# Patient Record
Sex: Female | Born: 1980 | Race: Black or African American | Hispanic: No | Marital: Single | Smoking: Current every day smoker
Health system: Southern US, Community
[De-identification: ages and names within clinical notes are randomized; demographics above are authoritative.]

---

## 2003-12-15 ENCOUNTER — Other Ambulatory Visit: Admission: RE | Admit: 2003-12-15 | Discharge: 2003-12-15 | Payer: Self-pay | Admitting: Obstetrics and Gynecology

## 2004-01-13 ENCOUNTER — Other Ambulatory Visit: Admission: RE | Admit: 2004-01-13 | Discharge: 2004-01-13 | Payer: Self-pay | Admitting: Obstetrics and Gynecology

## 2004-05-22 ENCOUNTER — Emergency Department (HOSPITAL_COMMUNITY): Admission: EM | Admit: 2004-05-22 | Discharge: 2004-05-22 | Payer: Self-pay | Admitting: *Deleted

## 2006-06-06 ENCOUNTER — Emergency Department (HOSPITAL_COMMUNITY): Admission: EM | Admit: 2006-06-06 | Discharge: 2006-06-06 | Payer: Self-pay | Admitting: Family Medicine

## 2006-07-22 ENCOUNTER — Emergency Department (HOSPITAL_COMMUNITY): Admission: EM | Admit: 2006-07-22 | Discharge: 2006-07-22 | Payer: Self-pay | Admitting: Family Medicine

## 2006-10-03 ENCOUNTER — Emergency Department (HOSPITAL_COMMUNITY): Admission: EM | Admit: 2006-10-03 | Discharge: 2006-10-03 | Payer: Self-pay | Admitting: Emergency Medicine

## 2007-01-13 ENCOUNTER — Emergency Department (HOSPITAL_COMMUNITY): Admission: EM | Admit: 2007-01-13 | Discharge: 2007-01-13 | Payer: Self-pay | Admitting: Emergency Medicine

## 2007-12-16 ENCOUNTER — Other Ambulatory Visit: Admission: RE | Admit: 2007-12-16 | Discharge: 2007-12-16 | Payer: Self-pay | Admitting: Gynecology

## 2008-07-26 ENCOUNTER — Emergency Department (HOSPITAL_COMMUNITY): Admission: EM | Admit: 2008-07-26 | Discharge: 2008-07-27 | Payer: Self-pay | Admitting: Emergency Medicine

## 2008-12-27 ENCOUNTER — Emergency Department (HOSPITAL_BASED_OUTPATIENT_CLINIC_OR_DEPARTMENT_OTHER): Admission: EM | Admit: 2008-12-27 | Discharge: 2008-12-27 | Payer: Self-pay | Admitting: Emergency Medicine

## 2009-06-23 ENCOUNTER — Emergency Department (HOSPITAL_BASED_OUTPATIENT_CLINIC_OR_DEPARTMENT_OTHER): Admission: EM | Admit: 2009-06-23 | Discharge: 2009-06-23 | Payer: Self-pay | Admitting: Emergency Medicine

## 2009-06-23 ENCOUNTER — Ambulatory Visit: Payer: Self-pay | Admitting: Diagnostic Radiology

## 2009-08-05 ENCOUNTER — Emergency Department (HOSPITAL_COMMUNITY): Admission: EM | Admit: 2009-08-05 | Discharge: 2009-08-05 | Payer: Self-pay | Admitting: Emergency Medicine

## 2009-11-27 ENCOUNTER — Emergency Department (HOSPITAL_COMMUNITY): Admission: EM | Admit: 2009-11-27 | Discharge: 2009-11-27 | Payer: Self-pay | Admitting: Emergency Medicine

## 2009-12-20 ENCOUNTER — Emergency Department (HOSPITAL_BASED_OUTPATIENT_CLINIC_OR_DEPARTMENT_OTHER): Admission: EM | Admit: 2009-12-20 | Discharge: 2009-12-20 | Payer: Self-pay | Admitting: Emergency Medicine

## 2009-12-20 ENCOUNTER — Ambulatory Visit: Payer: Self-pay | Admitting: Diagnostic Radiology

## 2010-04-10 ENCOUNTER — Emergency Department (HOSPITAL_COMMUNITY): Admission: EM | Admit: 2010-04-10 | Discharge: 2010-04-10 | Payer: Self-pay | Admitting: Emergency Medicine

## 2010-09-07 ENCOUNTER — Encounter: Admission: RE | Admit: 2010-09-07 | Discharge: 2010-09-07 | Payer: Self-pay | Admitting: Family Medicine

## 2011-01-25 LAB — D-DIMER, QUANTITATIVE: D-Dimer, Quant: 0.5 ug/mL-FEU — ABNORMAL HIGH (ref 0.00–0.48)

## 2011-02-21 LAB — URINALYSIS, ROUTINE W REFLEX MICROSCOPIC
Glucose, UA: NEGATIVE mg/dL
Nitrite: NEGATIVE
Protein, ur: NEGATIVE mg/dL
pH: 6.5 (ref 5.0–8.0)

## 2011-02-21 LAB — BASIC METABOLIC PANEL
Calcium: 9.3 mg/dL (ref 8.4–10.5)
Chloride: 107 mEq/L (ref 96–112)
Creatinine, Ser: 0.8 mg/dL (ref 0.4–1.2)
GFR calc Af Amer: >60 mL/min (ref >60)
GFR calc non Af Amer: >60 mL/min (ref >60)

## 2011-02-21 LAB — DIFFERENTIAL
Lymphocytes Relative: 32 % (ref 12–46)
Lymphs Abs: 1.9 10*3/uL (ref 0.7–4.0)
Monocytes Relative: 10 % (ref 3–12)
Neutro Abs: 3.4 10*3/uL (ref 1.7–7.7)
Neutrophils Relative %: 56 % (ref 43–77)

## 2011-02-21 LAB — CBC
RBC: 4.51 MIL/uL (ref 3.87–5.11)
WBC: 6.1 10*3/uL (ref 4.0–10.5)

## 2011-02-21 LAB — PREGNANCY, URINE: Preg Test, Ur: NEGATIVE

## 2011-10-12 ENCOUNTER — Encounter: Payer: Self-pay | Admitting: Emergency Medicine

## 2011-10-12 ENCOUNTER — Emergency Department (HOSPITAL_COMMUNITY)
Admission: EM | Admit: 2011-10-12 | Discharge: 2011-10-12 | Disposition: A | Discharge status: Home / Self Care | Payer: Self-pay | Attending: Emergency Medicine | Admitting: Emergency Medicine

## 2011-10-12 DIAGNOSIS — IMO0001 Reserved for inherently not codable concepts without codable children: Secondary | ICD-10-CM | POA: Insufficient documentation

## 2011-10-12 DIAGNOSIS — L0231 Cutaneous abscess of buttock: Secondary | ICD-10-CM | POA: Insufficient documentation

## 2011-10-12 DIAGNOSIS — R609 Edema, unspecified: Secondary | ICD-10-CM | POA: Insufficient documentation

## 2011-10-12 DIAGNOSIS — F17200 Nicotine dependence, unspecified, uncomplicated: Secondary | ICD-10-CM | POA: Insufficient documentation

## 2011-10-12 DIAGNOSIS — L03317 Cellulitis of buttock: Secondary | ICD-10-CM | POA: Insufficient documentation

## 2011-10-12 MED ORDER — KETOROLAC TROMETHAMINE 60 MG/2ML IM SOLN: 60.0000 mg | Freq: Once | INTRAMUSCULAR | Status: DC

## 2011-10-12 NOTE — ED Provider Notes (Signed)
Medical screening examination/treatment/procedure(s) were performed by non-physician practitioner and as supervising physician I was immediately available for consultation/collaboration.   Benny Lennert, MD 10/12/11 2025

## 2011-10-12 NOTE — ED Provider Notes (Signed)
History     CSN: [id number] Arrival date & time: 10/12/2011  2:50 PM   First MD Initiated Contact with Patient 10/12/11 1612      Chief Complaint  Patient presents with  . Abscess    (Consider location/radiation/quality/duration/timing/severity/associated sxs/prior treatment) HPI Comments: Patient here with right buttock abscess for the past 3 weeks - reports drained about 1week ago - none now - denies fever orchills.  Patient is a 30 y.o. female presenting with abscess. The history is provided by the patient. No language interpreter was used.  Abscess  This is a new problem. The current episode started more than one week ago. The onset was gradual. The problem occurs rarely. The problem has been unchanged. The abscess is present on the right buttock. The problem is moderate. The abscess is characterized by painfulness and swelling. The patient was exposed to OTC medications. The abscess first occurred at home. Pertinent negatives include no anorexia, not sleeping less, no fever, no diarrhea, no vomiting, no rhinorrhea and no sore throat. Her past medical history does not include skin abscesses in family. There were no sick contacts. She has received no recent medical care.    History reviewed. No pertinent past medical history.  History reviewed. No pertinent past surgical history.  History reviewed. No pertinent family history.  History  Substance Use Topics  . Smoking status: Current Everyday Smoker  . Smokeless tobacco: Not on file  . Alcohol Use: No    OB History    Grav Para Term Preterm Abortions TAB SAB Ect Mult Living                  Review of Systems  Constitutional: Negative for fever.  HENT: Negative for sore throat and rhinorrhea.   Gastrointestinal: Negative for vomiting, diarrhea and anorexia.  All other systems reviewed and are negative.    Allergies  Augmentin  Home Medications  No current outpatient prescriptions on file.  BP 138/77  Pulse 88   Temp(Src) 98.5 F (36.9 C) (Oral)  Resp 20  SpO2 100%  Physical Exam  Nursing note and vitals reviewed. Constitutional: She is oriented to person, place, and time. She appears well-developed and well-nourished. No distress.  HENT:  Head: Normocephalic and atraumatic.  Right Ear: External ear normal.  Left Ear: External ear normal.  Mouth/Throat: Oropharynx is clear and moist.  Eyes: Conjunctivae are normal. Pupils are equal, round, and reactive to light.  Neck: Normal range of motion. Neck supple.  Cardiovascular: Normal rate, regular rhythm and normal heart sounds.   Pulmonary/Chest: Effort normal. She exhibits no tenderness.  Abdominal: Soft. There is no tenderness.  Musculoskeletal: Normal range of motion.  Neurological: She is alert and oriented to person, place, and time.  Skin: Skin is warm and dry. Rash noted.  Psychiatric: She has a normal mood and affect. Her behavior is normal. Judgment and thought content normal.    ED Course  Procedures (including critical care time)  Labs Reviewed - No data to display No results found.   Right buttock abscess   MDM  Patient with abscess to right buttocks, no drainage now.  She reports to me that she cannot stay any longer as I was entering room to do I&D.  She reports she will return later for I&D        Scarlette Calico C. [place], [place] 10/12/11 1753

## 2011-10-12 NOTE — ED Notes (Signed)
Called for pt. Not in waiting room.

## 2011-10-12 NOTE — ED Notes (Signed)
Pt here for abscess to right buttocks x 3 weeks; pt sts some drainage but not at present

## 2015-07-04 ENCOUNTER — Encounter (HOSPITAL_COMMUNITY): Payer: Self-pay

## 2015-07-04 ENCOUNTER — Emergency Department (HOSPITAL_COMMUNITY)
Admission: EM | Admit: 2015-07-04 | Discharge: 2015-07-04 | Disposition: A | Discharge status: Home / Self Care | Payer: Self-pay | Attending: Emergency Medicine | Admitting: Emergency Medicine

## 2015-07-04 DIAGNOSIS — Z3202 Encounter for pregnancy test, result negative: Secondary | ICD-10-CM | POA: Insufficient documentation

## 2015-07-04 DIAGNOSIS — Z880 Allergy status to penicillin: Secondary | ICD-10-CM | POA: Insufficient documentation

## 2015-07-04 DIAGNOSIS — R55 Syncope and collapse: Secondary | ICD-10-CM | POA: Insufficient documentation

## 2015-07-04 DIAGNOSIS — Z720 Tobacco use: Secondary | ICD-10-CM | POA: Insufficient documentation

## 2015-07-04 LAB — BASIC METABOLIC PANEL
Anion gap: 8 (ref 5–15)
BUN: 10 mg/dL (ref 6–20)
CHLORIDE: 107 mmol/L (ref 101–111)
CO2: 25 mmol/L (ref 22–32)
CREATININE: 0.92 mg/dL (ref 0.44–1.00)
Calcium: 9.2 mg/dL (ref 8.9–10.3)
GFR calc non Af Amer: >60 mL/min (ref >60)
GLUCOSE: 71 mg/dL (ref 65–99)
Potassium: 4.2 mmol/L (ref 3.5–5.1)
Sodium: 140 mmol/L (ref 135–145)

## 2015-07-04 LAB — URINALYSIS, ROUTINE W REFLEX MICROSCOPIC
GLUCOSE, UA: NEGATIVE mg/dL
Ketones, ur: 15 mg/dL — AB
Nitrite: NEGATIVE
Protein, ur: 30 mg/dL — AB
SPECIFIC GRAVITY, URINE: 1.031 — AB (ref 1.005–1.030)
Urobilinogen, UA: 1 mg/dL (ref 0.0–1.0)
pH: 6 (ref 5.0–8.0)

## 2015-07-04 LAB — PREGNANCY, URINE: Preg Test, Ur: NEGATIVE

## 2015-07-04 LAB — CBC WITH DIFFERENTIAL/PLATELET
Basophils Absolute: 0 10*3/uL (ref 0.0–0.1)
Basophils Relative: 0 % (ref 0–1)
EOS ABS: 0.1 10*3/uL (ref 0.0–0.7)
Eosinophils Relative: 2 % (ref 0–5)
HEMATOCRIT: 39.2 % (ref 36.0–46.0)
HEMOGLOBIN: 13.4 g/dL (ref 12.0–15.0)
LYMPHS ABS: 1.6 10*3/uL (ref 0.7–4.0)
Lymphocytes Relative: 24 % (ref 12–46)
MCH: 30.4 pg (ref 26.0–34.0)
MCHC: 34.2 g/dL (ref 30.0–36.0)
MCV: 88.9 fL (ref 78.0–100.0)
MONO ABS: 0.6 10*3/uL (ref 0.1–1.0)
MONOS PCT: 9 % (ref 3–12)
NEUTROS PCT: 65 % (ref 43–77)
Neutro Abs: 4.3 10*3/uL (ref 1.7–7.7)
Platelets: 264 10*3/uL (ref 150–400)
RBC: 4.41 MIL/uL (ref 3.87–5.11)
RDW: 13.4 % (ref 11.5–15.5)
WBC: 6.7 10*3/uL (ref 4.0–10.5)

## 2015-07-04 LAB — URINE MICROSCOPIC-ADD ON

## 2015-07-04 NOTE — ED Notes (Signed)
MD Cook at the bedside.  

## 2015-07-04 NOTE — Discharge Instructions (Signed)
Tests were normal. Increase fluids. Eat frequent small meals. Follow-up with your primary care doctor.

## 2015-07-04 NOTE — ED Provider Notes (Signed)
CSN: [id number]     Arrival date & time 07/04/15  1548 History   First MD Initiated Contact with Patient 07/04/15 1607     Chief Complaint  Patient presents with  . Loss of Consciousness     (Consider location/radiation/quality/duration/timing/severity/associated sxs/prior Treatment) HPI... Status post syncopal event.   Patient was standing in a retail store eating a snack when she became  lightheaded and sat down and apparently "passed out". She then spontaneously woke up after a short period time. No residual neurological deficits. No seizure activity. This is never happened before. Her last menstrual period is now. She is currently taking an antibiotic for bacterial vaginosis. She is a healthy female carrier. She smokes 2 cigarettes a day. No alcohol.  History reviewed. No pertinent past medical history. History reviewed. No pertinent past surgical history. No family history on file. Social History  Substance Use Topics  . Smoking status: Current Every Day Smoker -- 0.25 packs/day    Types: Cigarettes  . Smokeless tobacco: None  . Alcohol Use: No   OB History    No data available     Review of Systems  All other systems reviewed and are negative.     Allergies  Amoxicillin-pot clavulanate  Home Medications   Prior to Admission medications   Not on File   BP 102/64 mmHg  Pulse 68  Temp(Src) 97.7 F (36.5 C) (Oral)  Resp 18  Ht 6\' 2"  (1.88 m)  Wt 131 lb (59.421 kg)  BMI 16.81 kg/m2  SpO2 100%  LMP 07/04/2015 (Exact Date) Physical Exam  Constitutional: She is oriented to person, place, and time. She appears well-developed and well-nourished.  HENT:  Head: Normocephalic and atraumatic.  Eyes: Conjunctivae and EOM are normal. Pupils are equal, round, and reactive to light.  Neck: Normal range of motion. Neck supple.  Cardiovascular: Normal rate and regular rhythm.   Pulmonary/Chest: Effort normal and breath sounds normal.  Abdominal: Soft. Bowel sounds are  normal.  Musculoskeletal: Normal range of motion.  Neurological: She is alert and oriented to person, place, and time.  Skin: Skin is warm and dry.  Psychiatric: She has a normal mood and affect. Her behavior is normal.  Nursing note and vitals reviewed.   ED Course  Procedures (including critical care time) Labs Review Labs Reviewed  URINALYSIS, ROUTINE W REFLEX MICROSCOPIC (NOT AT [facility]) - Abnormal; Notable for the following:    Color, Urine AMBER (*)    Specific Gravity, Urine 1.031 (*)    Hgb urine dipstick TRACE (*)    Bilirubin Urine SMALL (*)    Ketones, ur 15 (*)    Protein, ur 30 (*)    Leukocytes, UA TRACE (*)    All other components within normal limits  BASIC METABOLIC PANEL  CBC WITH DIFFERENTIAL/PLATELET  PREGNANCY, URINE  URINE MICROSCOPIC-ADD ON    Imaging Review No results found. I have personally reviewed and evaluated these images and lab results as part of my medical decision-making.   EKG Interpretation   Date/Time:  Sunday July 04 2015 16:04:03 EDT Ventricular Rate:  72 PR Interval:  157 QRS Duration: 83 QT Interval:  394 QTC Calculation: 431 R Axis:   85 Text Interpretation:  Sinus rhythm RSR' in V1 or V2, probably normal  variant Nonspecific T abnrm, anterolateral leads ST elev, probable normal  early repol pattern Confirmed by Adriana Simas  MD, Lulubelle Simcoe ([id number]) on 07/04/2015  4:24:44 PM      MDM   Final diagnoses:  Syncope,  unspecified syncope type    Patient is alert and oriented 3. No neurological deficits. Negative pregnancy. Hemoglobin and chemistry panel normal. EKG normal. Discussed findings with patient and her family    Donnetta Hutching, MD 07/04/15 [phone]

## 2015-07-04 NOTE — ED Notes (Signed)
Pt unable to void at the moment

## 2015-07-04 NOTE — ED Notes (Signed)
Family at bedside. 
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
## 2020-01-07 ENCOUNTER — Ambulatory Visit (HOSPITAL_COMMUNITY)
Admission: EM | Admit: 2020-01-07 | Discharge: 2020-01-07 | Disposition: A | Discharge status: Home / Self Care | Payer: 59 | Attending: Urgent Care | Admitting: Urgent Care

## 2020-01-07 ENCOUNTER — Other Ambulatory Visit: Payer: Self-pay

## 2020-01-07 ENCOUNTER — Encounter (HOSPITAL_COMMUNITY): Payer: Self-pay

## 2020-01-07 DIAGNOSIS — R200 Anesthesia of skin: Secondary | ICD-10-CM

## 2020-01-07 DIAGNOSIS — M79601 Pain in right arm: Secondary | ICD-10-CM

## 2020-01-07 DIAGNOSIS — M5412 Radiculopathy, cervical region: Secondary | ICD-10-CM

## 2020-01-07 MED ORDER — PREDNISONE 20 MG PO TABS: ORAL_TABLET | ORAL | 0 refills | Status: AC

## 2020-01-07 MED ORDER — TIZANIDINE HCL 4 MG PO TABS
4.0000 mg | ORAL_TABLET | Freq: Three times a day (TID) | ORAL | 0 refills | Status: AC | PRN
Start: 2020-01-07 — End: ?

## 2020-01-07 NOTE — ED Provider Notes (Signed)
[facility]   MRN: [id number] DOB: 01-25-1981  Subjective:   Stacy Arnold is a 39 y.o. female presenting for 1 month history of persistent and progressively worsening right arm pain that starts at her trapezius and shoots down into her hand with associated numbness and tingling.  Patient has used Aleve, rest and has only gotten very temporary relief.  The pain is now moderate to severe.  Denies any particular injury or inciting event.  Has relief with resting her arm above her head especially when she sleeps.  No current facility-administered medications for this encounter. No current outpatient medications on file.   Allergies  Allergen Reactions  . Amoxicillin-Pot Clavulanate Swelling    History reviewed. No pertinent past medical history.   History reviewed. No pertinent surgical history.  Family History  Problem Relation Age of Onset  . Healthy Mother   . Healthy Father     Social History   Tobacco Use  . Smoking status: Current Every Day Smoker    Packs/day: 0.25    Types: Cigarettes  . Smokeless tobacco: Never Used  Substance Use Topics  . Alcohol use: No  . Drug use: No    ROS   Objective:   Vitals: BP 100/67   Pulse 97   Temp 98.5 F (36.9 C)   Resp 18   LMP 01/05/2020   SpO2 98%   Physical Exam Constitutional:      General: She is not in acute distress.    Appearance: Normal appearance. She is well-developed. She is not ill-appearing, toxic-appearing or diaphoretic.  HENT:     Head: Normocephalic and atraumatic.     Nose: Nose normal.     Mouth/Throat:     Mouth: Mucous membranes are moist.     Pharynx: Oropharynx is clear.  Eyes:     General: No scleral icterus.    Extraocular Movements: Extraocular movements intact.     Pupils: Pupils are equal, round, and reactive to light.  Cardiovascular:     Rate and Rhythm: Normal rate.  Pulmonary:     Effort: Pulmonary effort is normal.  Musculoskeletal:     Cervical back: Spasms and  tenderness (Very focal tenderness over area outlined, palpation reproduces radicular symptoms down into her hand) present. No swelling, edema, deformity, erythema, signs of trauma, lacerations, rigidity, torticollis, bony tenderness or crepitus. Normal range of motion.       Back:     Comments: Negative Spurling maneuver and Lhermitte sign.  Positive shoulder abduction relief test.  Skin:    General: Skin is warm and dry.  Neurological:     General: No focal deficit present.     Mental Status: She is alert and oriented to person, place, and time.     Cranial Nerves: No cranial nerve deficit.     Motor: No weakness.     Coordination: Coordination normal.     Deep Tendon Reflexes: Reflexes normal.  Psychiatric:        Mood and Affect: Mood normal.        Behavior: Behavior normal.        Thought Content: Thought content normal.        Judgment: Judgment normal.     Assessment and Plan :   1. Cervical radiculopathy   2. Right arm pain   3. Numbness and tingling in right hand     We will use a steroid course to help address patient's cervical radiculopathy.  Counseled on back and neck care, use  tizanidine as needed.  Emphasized need to follow-up with neurosurgery. Counseled patient on potential for adverse effects with medications prescribed/recommended today, ER and return-to-clinic precautions discussed, patient verbalized understanding.    Jaynee Eagles, [place] 01/07/20 1417

## 2020-01-07 NOTE — ED Triage Notes (Signed)
Pt presents with complaints of pain in her left shoulder that radiates down her arm and into her hand causing numbness x greater than 1 month. Concerned for pinch nerve. Denies relief with otc treatment.

## 2020-03-08 ENCOUNTER — Encounter (HOSPITAL_COMMUNITY): Payer: Self-pay | Admitting: Emergency Medicine

## 2020-03-08 ENCOUNTER — Emergency Department (HOSPITAL_COMMUNITY): Payer: 59

## 2020-03-08 ENCOUNTER — Emergency Department (HOSPITAL_COMMUNITY)
Admission: EM | Admit: 2020-03-08 | Discharge: 2020-03-08 | Disposition: A | Discharge status: Home / Self Care | Payer: 59 | Attending: Emergency Medicine | Admitting: Emergency Medicine

## 2020-03-08 ENCOUNTER — Other Ambulatory Visit: Payer: Self-pay

## 2020-03-08 DIAGNOSIS — Y9389 Activity, other specified: Secondary | ICD-10-CM | POA: Diagnosis not present

## 2020-03-08 DIAGNOSIS — F17210 Nicotine dependence, cigarettes, uncomplicated: Secondary | ICD-10-CM | POA: Diagnosis not present

## 2020-03-08 DIAGNOSIS — R0789 Other chest pain: Secondary | ICD-10-CM

## 2020-03-08 DIAGNOSIS — Y999 Unspecified external cause status: Secondary | ICD-10-CM | POA: Insufficient documentation

## 2020-03-08 DIAGNOSIS — Y92410 Unspecified street and highway as the place of occurrence of the external cause: Secondary | ICD-10-CM | POA: Insufficient documentation

## 2020-03-08 MED ORDER — NAPROXEN 375 MG PO TABS: 375.0000 mg | ORAL_TABLET | Freq: Two times a day (BID) | ORAL | 0 refills | Status: AC

## 2020-03-08 MED ORDER — CYCLOBENZAPRINE HCL 10 MG PO TABS
10.0000 mg | ORAL_TABLET | Freq: Every day | ORAL | 0 refills | Status: AC
Start: 2020-03-08 — End: ?

## 2020-03-08 MED ORDER — CYCLOBENZAPRINE HCL 10 MG PO TABS
10.0000 mg | ORAL_TABLET | Freq: Once | ORAL | Status: AC
  Administered 2020-03-08: 10 mg via ORAL
  Filled 2020-03-08: qty 1

## 2020-03-08 NOTE — ED Notes (Signed)
Pt called for room, no answer x1  °
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
## 2020-03-08 NOTE — Discharge Instructions (Addendum)
Per our discussion, I am prescribing you 2 medications for your pain  The first medication is called Flexeril.  This is a muscle relaxant that has a strong sedating effect.  Please be sure to take this once a night as needed for pain as well as difficulty sleeping.  Please do not mix this medication with alcohol or drive a motor vehicle after taking it.  I have also prescribed you naproxen.  This is a strong anti-inflammatory medication similar to ibuprofen.  You can take this up to twice a day as needed for management of your pain.  You can additionally take Tylenol for breakthrough pain.  Please be sure to return to the emergency department if you develop any new or worsening symptoms.  I would recommend following up with your primary care provider regarding this visit.  It was a pleasure to meet you.
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
